# Patient Record
Sex: Male | Born: 1995 | Race: White | Hispanic: No | Marital: Single | Smoking: Former smoker
Health system: Southern US, Community
[De-identification: ages and names within clinical notes are randomized; demographics above are authoritative.]

## PROBLEM LIST (undated history)

## (undated) DIAGNOSIS — F410 Panic disorder [episodic paroxysmal anxiety] without agoraphobia: Secondary | ICD-10-CM

## (undated) DIAGNOSIS — F411 Generalized anxiety disorder: Secondary | ICD-10-CM

## (undated) DIAGNOSIS — F319 Bipolar disorder, unspecified: Secondary | ICD-10-CM

## (undated) DIAGNOSIS — G4700 Insomnia, unspecified: Secondary | ICD-10-CM

## (undated) DIAGNOSIS — F909 Attention-deficit hyperactivity disorder, unspecified type: Secondary | ICD-10-CM

---

## 2016-07-20 ENCOUNTER — Encounter: Payer: Self-pay | Admitting: Emergency Medicine

## 2016-07-20 ENCOUNTER — Emergency Department (INDEPENDENT_AMBULATORY_CARE_PROVIDER_SITE_OTHER)
Admission: EM | Admit: 2016-07-20 | Discharge: 2016-07-20 | Disposition: A | Discharge status: Home / Self Care | Payer: Self-pay | Source: Home / Self Care | Attending: Family Medicine | Admitting: Family Medicine

## 2016-07-20 DIAGNOSIS — R1010 Upper abdominal pain, unspecified: Secondary | ICD-10-CM

## 2016-07-20 DIAGNOSIS — K219 Gastro-esophageal reflux disease without esophagitis: Secondary | ICD-10-CM

## 2016-07-20 HISTORY — DX: Panic disorder (episodic paroxysmal anxiety): F41.0

## 2016-07-20 HISTORY — DX: Insomnia, unspecified: G47.00

## 2016-07-20 MED ORDER — OMEPRAZOLE 40 MG PO CPDR: 40.0000 mg | DELAYED_RELEASE_CAPSULE | Freq: Every day | ORAL | 2 refills | Status: AC

## 2016-07-20 NOTE — ED Provider Notes (Signed)
[name]    CSN: [id number] Arrival date & time: 07/20/16  1025     History   Chief Complaint Chief Complaint  Patient presents with  . Abdominal Pain    HPI Jeffrey Holder is a 20 y.o. male.   Patient complains of 3 day history of epigastric pain, worse after drinking water, but somewhat improved after eating.  The pain is worse supine and improves slightly after taking Tums.  He has had nausea without vomiting.  He has been constipated during the past 3 days but his bowel movements have been normal otherwise.  He denies melena and hematochezia.  He admits that he drinks about 64 ounces of liquor every 3 to 4 days.  He states that he has tried taking Ibuprofen 2 tabs every 6 to 8 hours for his pain.   The history is provided by the patient.  Abdominal Pain  Pain location:  Epigastric Pain quality: aching and gnawing   Pain radiates to:  Does not radiate Pain severity:  Moderate Onset quality:  Gradual Duration:  3 days Timing:  Intermittent Progression:  Unchanged Chronicity:  Recurrent Context: alcohol use and awakening from sleep   Context: not eating and not previous surgeries   Relieved by:  Eating Exacerbated by: drinking water. Ineffective treatments: Tums. Associated symptoms: anorexia, constipation, fatigue and nausea   Associated symptoms: no chest pain, no diarrhea, no dysuria, no fever, no flatus, no hematemesis, no hematochezia, no hematuria, no melena, no shortness of breath and no vomiting   Risk factors comment:  Alcohol abuse   Past Medical History:  Diagnosis Date  . Insomnia   . Panic disorder     There are no active problems to display for this patient.   History reviewed. No pertinent surgical history.     Home Medications    Prior to Admission medications   Medication Sig Start Date End Date Taking? Authorizing Provider  ALPRAZolam Prudy Feeler) 0.5 MG tablet Take 0.5 mg by mouth at bedtime as needed for anxiety.   Yes  Historical Provider, MD  omeprazole (PRILOSEC) 40 MG capsule Take 1 capsule (40 mg total) by mouth daily. Take 30 minutes before a meal. 07/20/16   Lattie Haw, MD    Family History No family history on file.  Social History Social History  Substance Use Topics  . Smoking status: Current Every Day Smoker    Packs/day: 1.00  . Smokeless tobacco: Never Used  . Alcohol use 15.0 oz/week    25 Standard drinks or equivalent per week     Allergies   Review of patient's allergies indicates no known allergies.   Review of Systems Review of Systems  Constitutional: Positive for fatigue. Negative for fever.  Respiratory: Negative for shortness of breath.   Cardiovascular: Negative for chest pain.  Gastrointestinal: Positive for abdominal pain, anorexia, constipation and nausea. Negative for diarrhea, flatus, hematemesis, hematochezia, melena and vomiting.  Genitourinary: Negative for dysuria and hematuria.  All other systems reviewed and are negative.    Physical Exam Triage Vital Signs ED Triage Vitals  Enc Vitals Group     BP 07/20/16 1050 143/92     Pulse Rate 07/20/16 1050 (!) 123     Resp --      Temp 07/20/16 1050 98.1 F (36.7 C)     Temp Source 07/20/16 1050 Oral     SpO2 07/20/16 1050 98 %     Weight 07/20/16 1051 143 lb 8 oz (65.1 kg)  Height 07/20/16 1051 5' 6.5" (1.689 m)     Head Circumference --      Peak Flow --      Pain Score 07/20/16 1053 7     Pain Loc --      Pain Edu? --      Excl. in [place]? --    No data found.   Updated Vital Signs BP 143/92 (BP Location: Left Arm)   Pulse (!) 123   Temp 98.1 F (36.7 C) (Oral)   Ht 5' 6.5" (1.689 m)   Wt 143 lb 8 oz (65.1 kg)   SpO2 98%   BMI 22.81 kg/m   Visual Acuity Right Eye Distance:   Left Eye Distance:   Bilateral Distance:    Right Eye Near:   Left Eye Near:    Bilateral Near:     Physical Exam  Constitutional: He appears well-developed and well-nourished. No distress.  HENT:    Head: Normocephalic.  Right Ear: External ear normal.  Left Ear: External ear normal.  Nose: Nose normal.  Mouth/Throat: Oropharynx is clear and moist.  Eyes: Conjunctivae are normal. Pupils are equal, round, and reactive to light.  Neck: Normal range of motion. Neck supple.  Cardiovascular: Normal heart sounds.   Pulmonary/Chest: Breath sounds normal.  Abdominal: Soft. Normal appearance and bowel sounds are normal. He exhibits no mass. There is tenderness in the epigastric area and periumbilical area. There is negative Murphy's sign.    Musculoskeletal: He exhibits no edema.  Lymphadenopathy:    He has no cervical adenopathy.  Neurological: He is alert.  Skin: Skin is warm and dry. No rash noted.  Nursing note and vitals reviewed.    UC Treatments / Results  Labs (all labs ordered are listed, but only abnormal results are displayed) Labs Reviewed - No data to display  EKG  EKG Interpretation None       Radiology No results found.  Procedures Procedures (including critical care time)  Medications Ordered in UC Medications - No data to display   Initial Impression / Assessment and Plan / UC Course  I have reviewed the triage vital signs and the nursing notes.  Pertinent labs & imaging results that were available during my care of the patient were reviewed by me and considered in my medical decision making (see chart for details).  Clinical Course  Patient's history suggests peptic ulcer, as well as GERD History of alcohol abuse.  Recommend discontinuing alcohol; consider AA.  Recommend discontinuing smoking. Begin omeprazole 40mg  Daily. Followup with Family Doctor as soon as possible for management.  Recommend GI evaluation also.     Final Clinical Impressions(s) / UC Diagnoses   Final diagnoses:  Pain of upper abdomen  Gastroesophageal reflux disease, esophagitis presence not specified    New Prescriptions New Prescriptions   OMEPRAZOLE (PRILOSEC) 40  MG CAPSULE    Take 1 capsule (40 mg total) by mouth daily. Take 30 minutes before a meal.     Lattie HawStephen A Mindel Friscia, MD 07/31/16 [id number]

## 2019-06-14 ENCOUNTER — Other Ambulatory Visit: Payer: Self-pay

## 2019-06-14 ENCOUNTER — Encounter: Payer: Self-pay | Admitting: Family Medicine

## 2019-06-14 ENCOUNTER — Emergency Department (INDEPENDENT_AMBULATORY_CARE_PROVIDER_SITE_OTHER)
Admission: EM | Admit: 2019-06-14 | Discharge: 2019-06-14 | Disposition: A | Discharge status: Home / Self Care | Payer: BLUE CROSS/BLUE SHIELD | Source: Home / Self Care

## 2019-06-14 DIAGNOSIS — K0889 Other specified disorders of teeth and supporting structures: Secondary | ICD-10-CM

## 2019-06-14 MED ORDER — CHLORHEXIDINE GLUCONATE 0.12 % MT SOLN: 15.0000 mL | Freq: Two times a day (BID) | OROMUCOSAL | 0 refills | Status: DC

## 2019-06-14 MED ORDER — AMOXICILLIN 875 MG PO TABS: 875.0000 mg | ORAL_TABLET | Freq: Two times a day (BID) | ORAL | 0 refills | Status: DC

## 2019-06-14 NOTE — ED Provider Notes (Signed)
[facility]    CSN: [id number] Arrival date & time: 06/14/19  1903      History   Chief Complaint Chief Complaint  Patient presents with  . Dental Pain    HPI Jeffrey Holder is a 23 y.o. male.   23 yo established [facility] patient with dental pain.  He has recurrent canker sores and also has had diagnosed a wisdom tooth.  He has dental insurance but it does not cover removal of wisdom tooth  He teaches music.     Past Medical History:  Diagnosis Date  . Insomnia   . Panic disorder     There are no active problems to display for this patient.   History reviewed. No pertinent surgical history.     Home Medications    Prior to Admission medications   Medication Sig Start Date End Date Taking? Authorizing Provider  amphetamine-dextroamphetamine (ADDERALL) 20 MG tablet Take 20 mg by mouth 2 (two) times daily.   Yes [provider]  gabapentin (NEURONTIN) 300 MG capsule Take 300 mg by mouth 3 (three) times daily.   Yes [provider]  QUEtiapine (SEROQUEL) 25 MG tablet Take 25 mg by mouth at bedtime.   Yes [provider]  amoxicillin (AMOXIL) 875 MG tablet Take 1 tablet (875 mg total) by mouth 2 (two) times daily. 06/14/19   Robyn Haber, MD  chlorhexidine (PERIDEX) 0.12 % solution Use as directed 15 mLs in the mouth or throat 2 (two) times daily. 06/14/19   Robyn Haber, MD  omeprazole (PRILOSEC) 40 MG capsule Take 1 capsule (40 mg total) by mouth daily. Take 30 minutes before a meal. 07/20/16   Kandra Nicolas, MD    Family History History reviewed. No pertinent family history.  Social History Social History   Tobacco Use  . Smoking status: Current Every Day Smoker    Packs/day: 1.00  . Smokeless tobacco: Never Used  Substance Use Topics  . Alcohol use: Yes    Alcohol/week: 25.0 standard drinks    Types: 25 Standard drinks or equivalent per week  . Drug use: No     Allergies   Ciprofloxacin   Review of Systems  Review of Systems  HENT: Positive for dental problem.   All other systems reviewed and are negative.    Physical Exam Triage Vital Signs ED Triage Vitals  Enc Vitals Group     BP      Pulse      Resp      Temp      Temp src      SpO2      Weight      Height      Head Circumference      Peak Flow      Pain Score      Pain Loc      Pain Edu?      Excl. in [place]?    No data found.  Updated Vital Signs BP (!) 152/78 (BP Location: Right Arm)   Pulse 63   Temp 98.4 F (36.9 C) (Oral)   Resp 18   Ht 5\' 6"  (1.676 m)   Wt 55.8 kg   SpO2 100%   BMI 19.85 kg/m      Physical Exam Vitals signs and nursing note reviewed.  Constitutional:      Appearance: Normal appearance. He is normal weight.  HENT:     Mouth/Throat:     Mouth: Mucous membranes are moist.  Comments: Mild leukoplakia surrounding erupting wisdom tooth on right lower area. Neck:     Musculoskeletal: Normal range of motion and neck supple.  Cardiovascular:     Rate and Rhythm: Normal rate.  Pulmonary:     Effort: Pulmonary effort is normal.  Musculoskeletal: Normal range of motion.  Lymphadenopathy:     Cervical: No cervical adenopathy.  Skin:    General: Skin is warm.  Neurological:     General: No focal deficit present.     Mental Status: He is alert.      UC Treatments / Results  Labs (all labs ordered are listed, but only abnormal results are displayed) Labs Reviewed - No data to display  EKG   Radiology No results found.  Procedures Procedures (including critical care time)  Medications Ordered in UC Medications - No data to display  Initial Impression / Assessment and Plan / UC Course  I have reviewed the triage vital signs and the nursing notes.  Pertinent labs & imaging results that were available during my care of the patient were reviewed by me and considered in my medical decision making (see chart for details).    Final Clinical Impressions(s) / UC Diagnoses    Final diagnoses:  Pain, dental     Discharge Instructions     Ultimately, you will need to have a dentist evaluate the wisdom tooth    ED Prescriptions    Medication Sig Dispense Auth. Provider   chlorhexidine (PERIDEX) 0.12 % solution Use as directed 15 mLs in the mouth or throat 2 (two) times daily. 120 mL Elvina SidleLauenstein, Melecio Cueto, MD   amoxicillin (AMOXIL) 875 MG tablet Take 1 tablet (875 mg total) by mouth 2 (two) times daily. 20 tablet Elvina SidleLauenstein, Annora Guderian, MD     Controlled Substance Prescriptions [place] Controlled Substance Registry consulted? Not Applicable   Elvina SidleLauenstein, Cornelius Marullo, MD 06/14/19 1929

## 2019-06-14 NOTE — ED Triage Notes (Signed)
Patient c/o pain on the right side of his mouth. Using clove and coconut oil with relief.

## 2019-06-14 NOTE — Discharge Instructions (Addendum)
Ultimately, you will need to have a dentist evaluate the wisdom tooth

## 2019-11-30 ENCOUNTER — Emergency Department
Admission: EM | Admit: 2019-11-30 | Discharge: 2019-11-30 | Disposition: A | Discharge status: Home / Self Care | Payer: BC Managed Care – PPO | Source: Home / Self Care | Attending: Family Medicine | Admitting: Family Medicine

## 2019-11-30 ENCOUNTER — Emergency Department (INDEPENDENT_AMBULATORY_CARE_PROVIDER_SITE_OTHER): Payer: BC Managed Care – PPO

## 2019-11-30 ENCOUNTER — Other Ambulatory Visit: Payer: Self-pay

## 2019-11-30 DIAGNOSIS — R1030 Lower abdominal pain, unspecified: Secondary | ICD-10-CM

## 2019-11-30 LAB — POCT URINALYSIS DIP (MANUAL ENTRY)
Bilirubin, UA: NEGATIVE
Blood, UA: NEGATIVE
Glucose, UA: NEGATIVE mg/dL
Ketones, POC UA: NEGATIVE mg/dL
Leukocytes, UA: NEGATIVE
Nitrite, UA: NEGATIVE
Protein Ur, POC: NEGATIVE mg/dL
Spec Grav, UA: 1.02 (ref 1.010–1.025)
Urobilinogen, UA: 0.2 E.U./dL
pH, UA: 8.5 — AB (ref 5.0–8.0)

## 2019-11-30 LAB — POCT CBC W AUTO DIFF (K'VILLE URGENT CARE)

## 2019-11-30 NOTE — ED Provider Notes (Signed)
[facility]    CSN: [id number] Arrival date & time: 11/30/19  1814      History   Chief Complaint Chief Complaint  Patient presents with  . Abdominal Pain  . Rectal Pain  . Diarrhea    HPI Jeffrey Holder is a 24 y.o. male.   I personally reviewed patient's recent [facility] records. Patient was evaluated by his PCP on 11/19/19 for STD testing, reporting that he had had anal receptive sex the prior week.  He had had some perineal discomfort and a small amount of anal bleeding the next day that had resolved. His evaluation was unremarkable including negative RPR, GC/chlamydia, HIV.  His CBC and CMP were unremarkable. Recently he has developed persistent lower abdominal pain and loose stools.  He is concerned that he may have seen some worms in his stool.  He has had vague perineal discomfort but no pain with defecation.  He denies recent hematochezia or melena.  He denies fever but has had some chills today.  He denies dysuria, frequency, and urethral discharge.           Abdominal Pain Associated symptoms: chills, diarrhea, fatigue and nausea   Associated symptoms: no constipation, no fever and no vomiting   Diarrhea Associated symptoms: abdominal pain and chills   Associated symptoms: no diaphoresis, no fever and no vomiting     Past Medical History:  Diagnosis Date  . Insomnia   . Panic disorder     There are no problems to display for this patient.   History reviewed. No pertinent surgical history.     Home Medications    Prior to Admission medications   Medication Sig Start Date End Date Taking? Authorizing Provider  amoxicillin (AMOXIL) 875 MG tablet Take 1 tablet (875 mg total) by mouth 2 (two) times daily. 06/14/19   Elvina Sidle, MD  amphetamine-dextroamphetamine (ADDERALL) 20 MG tablet Take 20 mg by mouth 2 (two) times daily.    [provider]  chlorhexidine (PERIDEX) 0.12 % solution Use as directed 15 mLs in the mouth or  throat 2 (two) times daily. 06/14/19   Elvina Sidle, MD  gabapentin (NEURONTIN) 300 MG capsule Take 300 mg by mouth 3 (three) times daily.    [provider]  omeprazole (PRILOSEC) 40 MG capsule Take 1 capsule (40 mg total) by mouth daily. Take 30 minutes before a meal. 07/20/16   Lattie Haw, MD  QUEtiapine (SEROQUEL) 25 MG tablet Take 25 mg by mouth at bedtime.    [provider]    Family History History reviewed. No pertinent family history.  Social History Social History   Tobacco Use  . Smoking status: Former Smoker    Packs/day: 1.00    Quit date: 11/29/2017    Years since quitting: 2.0  . Smokeless tobacco: Never Used  Substance Use Topics  . 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 Drug use: Yes    Types: Marijuana    Comment: weekly     Allergies   Ciprofloxacin   Review of Systems Review of Systems  Constitutional: Positive for activity change, appetite change, chills and fatigue. Negative for diaphoresis, fever and unexpected weight change.  HENT: Negative.   Eyes: Negative.   Respiratory: Negative.   Cardiovascular: Negative.   Gastrointestinal: Positive for abdominal pain, diarrhea and nausea. Negative for abdominal  distention, anal bleeding, blood in stool, constipation, rectal pain and vomiting.  Genitourinary: Negative.   Musculoskeletal: Positive for back pain.  Skin: Negative.   Neurological: Negative.      Physical Exam Triage Vital Signs ED Triage Vitals  Enc Vitals Group     BP 11/30/19 1830 132/78     Pulse Rate 11/30/19 1830 98     Resp 11/30/19 1830 20     Temp 11/30/19 1830 98.9 F (37.2 C)     Temp Source 11/30/19 1830 Oral     SpO2 11/30/19 1830 100 %     Weight 11/30/19 1831 124 lb (56.2 kg)     Height 11/30/19 1831 5\' 6"  (1.676 m)     Head Circumference --      Peak Flow --      Pain Score  11/30/19 1831 6     Pain Loc --      Pain Edu? --      Excl. in [place]? --    No data found.  Updated Vital Signs BP 132/78 (BP Location: Right Arm)   Pulse 98   Temp 98.9 F (37.2 C) (Oral)   Resp 20   Ht 5\' 6"  (1.676 m)   Wt 56.2 kg   SpO2 100%   BMI 20.01 kg/m   Visual Acuity Right Eye Distance:   Left Eye Distance:   Bilateral Distance:    Right Eye Near:   Left Eye Near:    Bilateral Near:     Physical Exam Vitals and nursing note reviewed.  Constitutional:      General: He is not in acute distress.    Appearance: He is not ill-appearing.  HENT:     Head: Normocephalic.     Nose: Nose normal.     Mouth/Throat:     Mouth: Mucous membranes are moist.     Pharynx: Oropharynx is clear.  Eyes:     Conjunctiva/sclera: Conjunctivae normal.     Pupils: Pupils are equal, round, and reactive to light.  Cardiovascular:     Rate and Rhythm: Normal rate.     Heart sounds: Normal heart sounds.  Pulmonary:     Breath sounds: Normal breath sounds.  Abdominal:     General: Abdomen is flat. Bowel sounds are normal. There is no distension.     Palpations: Abdomen is soft. There is no hepatomegaly or splenomegaly.     Tenderness: There is abdominal tenderness in the right lower quadrant, periumbilical area and left lower quadrant. There is no right CVA tenderness, left CVA tenderness, guarding or rebound. Negative signs include McBurney's sign.       Comments: No perirectal tenderness palpated.  Musculoskeletal:     Right lower leg: No edema.     Left lower leg: No edema.  Lymphadenopathy:     Cervical: No cervical adenopathy.  Skin:    General: Skin is warm and dry.     Findings: No rash.  Neurological:     Mental Status: He is alert and oriented to person, place, and time.      UC Treatments / Results  Labs (all labs ordered are listed, but only abnormal results are displayed) Labs Reviewed  POCT URINALYSIS DIP (MANUAL ENTRY) - Abnormal; Notable for the  following components:      Result Value   pH, UA 8.5 (*)    All other components within normal limits  POCT CBC W AUTO DIFF ([facility]):  WBC 3.2; LY 49.0; MO  3.2; GR 47.8; Hgb 14.1; Platelets 296     EKG   Radiology DG Abdomen 1 View  Result Date: 11/30/2019 CLINICAL DATA:  Lower abdominal pain EXAM: ABDOMEN - 1 VIEW COMPARISON:  None. FINDINGS: The bowel gas pattern is normal. Moderate amount of rectal colonic stool present. No radio-opaque calculi or other significant radiographic abnormality are seen. IMPRESSION: Moderate amount of rectal colonic stool. Nonobstructive bowel gas pattern. Electronically Signed   By: Jonna Clark M.D.   On: 11/30/2019 19:37    Procedures Procedures (including critical care time)  Medications Ordered in UC Medications - No data to display  Initial Impression / Assessment and Plan / UC Course  I have reviewed the triage vital signs and the nursing notes.  Pertinent labs & imaging results that were available during my care of the patient were reviewed by me and considered in my medical decision making (see chart for details).     Note mild leukopenia (WBC 3.2); ?viral syndrome. Suspect viral gastroenteritis. Note moderate amount of rectal colonic stool.  Suspect constipation as source of his vague perineal discomfort.  Will send stool specimen for O and P evaluation (patient had concern for possible parasitic infection). Followup with Family Doctor in about one week.  Final Clinical Impressions(s) / UC Diagnoses   Final diagnoses:  Lower abdominal pain     Discharge Instructions     Begin Pedialyte until diarrhea improves, then switch to clear liquids (apple juice, Jello, etc) until improved, then advance to a [employer] (Bananas, Rice, Applesauce, Toast).  Then gradually resume a regular diet when tolerated.  Avoid milk products until well.   Recommend taking daily Miralax, approximately 1/4 to 1/2 capful mixed in 4 to 8 ounces of  water until bowel movements are regular. Recommend increasing natural fiber in diet.  May also take a daily fiber product such as Citrucel with plenty of fluid.      If symptoms become significantly worse during the night or over the weekend, proceed to the local emergency room.      ED Prescriptions    None        Lattie Haw, MD 12/01/19 2303

## 2019-11-30 NOTE — ED Triage Notes (Signed)
Pain in abdomen for several days, the pain has been going on since was seen by pcp. Has been having rectal pain since.  Pt has also been having diarrhea.  Anal pain since seen by PCP.  Had blood in stool at first, but blood has resolved.  Now has mucous in stool.

## 2019-11-30 NOTE — Discharge Instructions (Signed)
Begin Pedialyte until diarrhea improves, then switch to clear liquids (apple juice, Jello, etc) until improved, then advance to a [employer] (Bananas, Rice, Applesauce, Toast).  Then gradually resume a regular diet when tolerated.  Avoid milk products until well.   Recommend taking daily Miralax, approximately 1/4 to 1/2 capful mixed in 4 to 8 ounces of water until bowel movements are regular. Recommend increasing natural fiber in diet.  May also take a daily fiber product such as Citrucel with plenty of fluid.      If symptoms become significantly worse during the night or over the weekend, proceed to the local emergency room.

## 2020-01-28 ENCOUNTER — Other Ambulatory Visit: Payer: Self-pay

## 2020-01-28 ENCOUNTER — Encounter: Payer: Self-pay | Admitting: Emergency Medicine

## 2020-01-28 ENCOUNTER — Emergency Department
Admission: EM | Admit: 2020-01-28 | Discharge: 2020-01-28 | Disposition: A | Discharge status: Home / Self Care | Payer: BC Managed Care – PPO | Source: Home / Self Care | Attending: Family Medicine | Admitting: Family Medicine

## 2020-01-28 DIAGNOSIS — M5441 Lumbago with sciatica, right side: Secondary | ICD-10-CM

## 2020-01-28 DIAGNOSIS — Z202 Contact with and (suspected) exposure to infections with a predominantly sexual mode of transmission: Secondary | ICD-10-CM

## 2020-01-28 HISTORY — DX: Bipolar disorder, unspecified: F31.9

## 2020-01-28 HISTORY — DX: Attention-deficit hyperactivity disorder, unspecified type: F90.9

## 2020-01-28 HISTORY — DX: Generalized anxiety disorder: F41.1

## 2020-01-28 LAB — POCT URINALYSIS DIP (MANUAL ENTRY)
Bilirubin, UA: NEGATIVE
Blood, UA: NEGATIVE
Glucose, UA: NEGATIVE mg/dL
Ketones, POC UA: NEGATIVE mg/dL
Leukocytes, UA: NEGATIVE
Nitrite, UA: NEGATIVE
Protein Ur, POC: NEGATIVE mg/dL
Spec Grav, UA: 1.02 (ref 1.010–1.025)
Urobilinogen, UA: 0.2 E.U./dL
pH, UA: 5.5 (ref 5.0–8.0)

## 2020-01-28 MED ORDER — PREDNISONE 20 MG PO TABS: ORAL_TABLET | ORAL | 0 refills | Status: DC

## 2020-01-28 NOTE — ED Provider Notes (Signed)
[name]    CSN: [id number] Arrival date & time: 01/28/20  1824      History   Chief Complaint Chief Complaint  Patient presents with  . Back Pain  . Exposure to STD    HPI Jeffrey Holder is a 24 y.o. male.   Patient presents for repeat STD testing, although he is assymptomatic at present and feels well.  He had a thorough negative evaluation through [facility] on 12/27/19.  He complains of persistent vague pain in his right lower back for two weeks.  During the past several days he has developed intermittent radiation into his right posterior thigh.  He recalls no injury.   He denies bowel or bladder dysfunction, and no saddle numbness.    Back Pain Location:  Lumbar spine Quality:  Aching Radiates to:  R posterior upper leg Pain severity:  Mild Pain is:  Same all the time Onset quality:  Gradual Duration:  2 weeks Timing:  Constant Progression:  Worsening Chronicity:  New Context: not recent injury   Relieved by:  None tried Worsened by:  Movement Ineffective treatments:  None tried Associated symptoms: paresthesias   Associated symptoms: no abdominal pain, no abdominal swelling, no bladder incontinence, no bowel incontinence, no dysuria, no fever, no leg pain, no numbness, no pelvic pain, no perianal numbness, no tingling, no weakness and no weight loss     Past Medical History:  Diagnosis Date  . ADHD   . Bipolar 1 disorder (HCC)   . GAD (generalized anxiety disorder)   . Insomnia   . Panic disorder     There are no problems to display for this patient.   History reviewed. No pertinent surgical history.     Home Medications    Prior to Admission medications   Medication Sig Start Date End Date Taking? Authorizing Provider  amphetamine-dextroamphetamine (ADDERALL) 20 MG tablet Take 20 mg by mouth 2 (two) times daily.    [provider]  chlorhexidine (PERIDEX) 0.12 % solution Use as directed 15 mLs in the mouth or throat 2 (two)  times daily. 06/14/19   Elvina Sidle, MD  gabapentin (NEURONTIN) 300 MG capsule Take 300 mg by mouth 3 (three) times daily.    [provider]  omeprazole (PRILOSEC) 40 MG capsule Take 1 capsule (40 mg total) by mouth daily. Take 30 minutes before a meal. 07/20/16   Lattie Haw, MD  predniSONE (DELTASONE) 20 MG tablet Take one tab by mouth twice daily for 4 days, then one daily. Take with food. 01/28/20   Lattie Haw, MD  QUEtiapine (SEROQUEL) 25 MG tablet Take 25 mg by mouth at bedtime.    [provider]    Family History No family history on file.  Social History Social History   Tobacco Use  . Smoking status: Former Smoker    Packs/day: 1.00    Quit date: 11/29/2017    Years since quitting: 2.1  . Smokeless tobacco: Never Used  Substance Use Topics  . 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 Drug use: Not Currently    Types: Marijuana, Cocaine    Comment: weekly     Allergies   Ciprofloxacin   Review of Systems Review of Systems  Constitutional: Negative for chills, diaphoresis, fatigue, fever, unexpected weight change and weight loss.  Gastrointestinal: Negative for abdominal pain  and bowel incontinence.  Genitourinary: Negative for bladder incontinence, difficulty urinating, discharge, dysuria, flank pain, frequency, genital sores, hematuria, pelvic pain, penile pain, penile swelling, scrotal swelling, testicular pain and urgency.  Musculoskeletal: Positive for back pain.  Neurological: Positive for paresthesias. Negative for tingling, weakness and numbness.  All other systems reviewed and are negative.    Physical Exam Triage Vital Signs ED Triage Vitals  Enc Vitals Group     BP      Pulse      Resp      Temp      Temp src      SpO2      Weight      Height      Head Circumference      Peak Flow      Pain Score        Pain Loc      Pain Edu?      Excl. in GC?    No data found.  Updated Vital Signs BP 105/71 (BP Location: Right Arm)   Pulse (!) 108   Temp 98.3 F (36.8 C) (Oral)   Resp 18   Ht 5\' 6"  (1.676 m)   Wt 58.1 kg   SpO2 96%   BMI 20.66 kg/m   Visual Acuity Right Eye Distance:   Left Eye Distance:   Bilateral Distance:    Right Eye Near:   Left Eye Near:    Bilateral Near:     Physical Exam Vitals and nursing note reviewed.  Constitutional:      General: He is not in acute distress.    Appearance: He is normal weight.  HENT:     Head: Normocephalic.     Nose: Nose normal.     Mouth/Throat:     Pharynx: Oropharynx is clear.  Eyes:     Conjunctiva/sclera: Conjunctivae normal.     Pupils: Pupils are equal, round, and reactive to light.  Cardiovascular:     Rate and Rhythm: Tachycardia present.     Heart sounds: Normal heart sounds.  Pulmonary:     Breath sounds: Normal breath sounds.  Abdominal:     Palpations: Abdomen is soft.     Tenderness: There is no abdominal tenderness.  Musculoskeletal:        General: Normal range of motion.     Lumbar back: No swelling, spasms, tenderness or bony tenderness. Normal range of motion. Negative right straight leg raise test and negative left straight leg raise test.       Back:     Comments: Back:  Range of motion relatively well preserved.  Can heel/toe walk and squat without difficulty. Patient has pain in the right paraspinous muscles from L4 to Sacral area but there is no tenderness to palpation in this area.  Straight leg raising test is negative.  Sitting knee extension test is negative.  Strength and sensation in the lower extremities is normal.  Patellar and achilles reflexes are normal   Lymphadenopathy:     Cervical: No cervical adenopathy.  Skin:    General: Skin is warm and dry.     Findings: No rash.  Neurological:     Mental Status: He is alert.      UC Treatments / Results  Labs (all labs ordered are  listed, but only abnormal results are displayed) Labs Reviewed  POCT URINALYSIS DIP (MANUAL ENTRY) - Abnormal; Notable for the following components:      Result Value   Color, UA light  yellow (*)    All other components within normal limits  C. TRACHOMATIS/N. GONORRHOEAE RNA  GC/CHLAMYDIA PROBE AMP  RPR  HIV ANTIBODY (ROUTINE TESTING W REFLEX)  HSV 2 ANTIBODY, IGG    EKG   Radiology No results found.  Procedures Procedures (including critical care time)  Medications Ordered in UC Medications - No data to display  Initial Impression / Assessment and Plan / UC Course  I have reviewed the triage vital signs and the nursing notes.  Pertinent labs & imaging results that were available during my care of the patient were reviewed by me and considered in my medical decision making (see chart for details).    STD screening labs as noted. Begin prednisone burst/taper. Followup with Dr. Aundria Mems ([facility]) if back pain not improving about two weeks.   Final Clinical Impressions(s) / UC Diagnoses   Final diagnoses:  Exposure to STD  Acute right-sided low back pain with right-sided sciatica     Discharge Instructions     Apply ice pack to right lower back for 20 to 30 minutes, 3 to 4 times daily  Continue until pain decreases.     ED Prescriptions    Medication Sig Dispense Auth. Provider   predniSONE (DELTASONE) 20 MG tablet Take one tab by mouth twice daily for 4 days, then one daily. Take with food. 12 tablet Kandra Nicolas, MD        Kandra Nicolas, MD 01/31/20 [phone]

## 2020-01-28 NOTE — Discharge Instructions (Addendum)
Apply ice pack to right lower back for 20 to 30 minutes, 3 to 4 times daily  Continue until pain decreases.

## 2020-01-28 NOTE — ED Triage Notes (Signed)
Patient here for pain in right lower back intermittent for past 2 weeks since sleeping wrong; and for follow up on STD testing. He had complete STD work up at [facility] end of March with all tests negative except for HSV; was told too soon from exposure time to know if HIV was validated with test. No known exposure covid positive person.

## 2020-01-29 LAB — C. TRACHOMATIS/N. GONORRHOEAE RNA
C. trachomatis RNA, TMA: NOT DETECTED
N. gonorrhoeae RNA, TMA: NOT DETECTED

## 2020-01-31 LAB — HSV 2 ANTIBODY, IGG: HSV 2 Glycoprotein G Ab, IgG: <0.9 index

## 2020-01-31 LAB — RPR: RPR Ser Ql: NONREACTIVE

## 2020-01-31 LAB — HIV ANTIBODY (ROUTINE TESTING W REFLEX): HIV 1&2 Ab, 4th Generation: NONREACTIVE

## 2020-04-14 ENCOUNTER — Emergency Department (INDEPENDENT_AMBULATORY_CARE_PROVIDER_SITE_OTHER)
Admission: EM | Admit: 2020-04-14 | Discharge: 2020-04-14 | Disposition: A | Discharge status: Home / Self Care | Payer: BLUE CROSS/BLUE SHIELD | Source: Home / Self Care | Attending: Family Medicine | Admitting: Family Medicine

## 2020-04-14 ENCOUNTER — Other Ambulatory Visit: Payer: Self-pay

## 2020-04-14 DIAGNOSIS — K0889 Other specified disorders of teeth and supporting structures: Secondary | ICD-10-CM | POA: Diagnosis not present

## 2020-04-14 MED ORDER — HYDROCODONE-ACETAMINOPHEN 5-325 MG PO TABS: ORAL_TABLET | ORAL | 0 refills | Status: DC

## 2020-04-14 MED ORDER — AMOXICILLIN-POT CLAVULANATE 875-125 MG PO TABS: 1.0000 | ORAL_TABLET | Freq: Two times a day (BID) | ORAL | 0 refills | Status: AC

## 2020-04-14 NOTE — Discharge Instructions (Addendum)
Try warm salt water gargles for sore throat.   If symptoms become significantly worse during the night or over the weekend, proceed to the local emergency room.

## 2020-04-14 NOTE — ED Provider Notes (Signed)
[facility]    CSN: [id number] Arrival date & time: 04/14/20  1759      History   Chief Complaint Chief Complaint  Patient presents with  . Dental Pain    HPI Jeffrey Holder is a 24 y.o. male.   Patient complains of pain/swelling in an un-erupted left lower wisdom tooth for four days.  He has had mild swelling in his left face, mild sore throat, and chills.  He denies trismus.  The history is provided by the patient.  Dental Pain Location:  Lower Lower teeth location:  17/LL 3rd molar Quality:  Throbbing, aching, constant and localized Severity:  Moderate Onset quality:  Gradual Duration:  4 days Timing:  Constant Progression:  Worsening Chronicity:  Recurrent Context: poor dentition   Relieved by:  Nothing Worsened by:  Jaw movement and pressure Ineffective treatments:  NSAIDs Associated symptoms: facial pain, facial swelling and gum swelling   Associated symptoms: no congestion, no difficulty swallowing, no drooling, no fever, no headaches, no neck pain, no neck swelling, no oral bleeding, no oral lesions and no trismus   Risk factors: lack of dental care     Past Medical History:  Diagnosis Date  . ADHD   . Bipolar 1 disorder (HCC)   . GAD (generalized anxiety disorder)   . Insomnia   . Panic disorder     There are no problems to display for this patient.   History reviewed. No pertinent surgical history.     Home Medications    Prior to Admission medications   Medication Sig Start Date End Date Taking? Authorizing Provider  amoxicillin-clavulanate (AUGMENTIN) 875-125 MG tablet Take 1 tablet by mouth 2 (two) times daily for 14 days. 04/14/20 04/28/20  Lattie Haw, MD  amphetamine-dextroamphetamine (ADDERALL) 20 MG tablet Take 20 mg by mouth 2 (two) times daily.    [provider]  chlorhexidine (PERIDEX) 0.12 % solution Use as directed 15 mLs in the mouth or throat 2 (two) times daily. 06/14/19   Elvina Sidle, MD  gabapentin  (NEURONTIN) 300 MG capsule Take 300 mg by mouth 3 (three) times daily.    [provider]  HYDROcodone-acetaminophen (NORCO/VICODIN) 5-325 MG tablet Take one by mouth at bedtime as needed for pain 04/14/20   Lattie Haw, MD  omeprazole (PRILOSEC) 40 MG capsule Take 1 capsule (40 mg total) by mouth daily. Take 30 minutes before a meal. 07/20/16   Lattie Haw, MD  predniSONE (DELTASONE) 20 MG tablet Take one tab by mouth twice daily for 4 days, then one daily. Take with food. 01/28/20   Lattie Haw, MD  QUEtiapine (SEROQUEL) 25 MG tablet Take 25 mg by mouth at bedtime.    [provider]    Family History Family History  Problem Relation Age of Onset  . Healthy Mother   . Healthy Father     Social History Social History   Tobacco Use  . Smoking status: Former Smoker    Packs/day: 1.00    Quit date: 11/29/2017    Years since quitting: 2.3  . Smokeless tobacco: Never Used  Vaping Use  . Vaping Use: Some days  . Substances: Nicotine, Flavoring  Substance Use Topics  . 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 Drug use: Not Currently    Types: Marijuana    Comment: weekly  Allergies   Ciprofloxacin   Review of Systems Review of Systems  Constitutional: Positive for chills. Negative for appetite change, diaphoresis, fatigue and fever.  HENT: Positive for facial swelling. Negative for congestion, drooling and mouth sores.   Musculoskeletal: Negative for neck pain.  Neurological: Negative for headaches.  All other systems reviewed and are negative.    Physical Exam Triage Vital Signs ED Triage Vitals  Enc Vitals Group     BP 04/14/20 1819 114/67     Pulse Rate 04/14/20 1819 65     Resp 04/14/20 1819 16     Temp 04/14/20 1819 98.5 F (36.9 C)     Temp Source 04/14/20 1819 Oral     SpO2 04/14/20 1819 99 %     Weight --       Height --      Head Circumference --      Peak Flow --      Pain Score 04/14/20 1817 5     Pain Loc --      Pain Edu? --      Excl. in GC? --    No data found.  Updated Vital Signs BP 114/67 (BP Location: Right Arm)   Pulse 65   Temp 98.5 F (36.9 C) (Oral)   Resp 16   SpO2 99%   Visual Acuity Right Eye Distance:   Left Eye Distance:   Bilateral Distance:    Right Eye Near:   Left Eye Near:    Bilateral Near:     Physical Exam Vitals and nursing note reviewed.  Constitutional:      General: He is not in acute distress. HENT:     Head: Normocephalic.     Right Ear: There is impacted cerumen.     Left Ear: There is impacted cerumen.     Nose: No congestion.     Mouth/Throat:     Mouth: Mucous membranes are moist.     Dentition: Dental tenderness and gingival swelling present.      Comments: Tenderness at gingiva surrounding non-erupted tooth #17.  Mild swelling/tenderness left cheek.  No trismus. Eyes:     Conjunctiva/sclera: Conjunctivae normal.     Pupils: Pupils are equal, round, and reactive to light.  Cardiovascular:     Rate and Rhythm: Regular rhythm.     Heart sounds: Normal heart sounds.  Pulmonary:     Breath sounds: Normal breath sounds.  Musculoskeletal:     Cervical back: Neck supple.  Lymphadenopathy:     Cervical: No cervical adenopathy.  Skin:    General: Skin is warm and dry.     Findings: No rash.  Neurological:     Mental Status: He is alert.      UC Treatments / Results  Labs (all labs ordered are listed, but only abnormal results are displayed) Labs Reviewed - No data to display  EKG   Radiology No results found.  Procedures Procedures (including critical care time)  Medications Ordered in UC Medications - No data to display  Initial Impression / Assessment and Plan / UC Course  I have reviewed the triage vital signs and the nursing notes.  Pertinent labs & imaging results that were available during my care of the  patient were reviewed by me and considered in my medical decision making (see chart for details).    Begin Augmentin. Rx for Lortab at bedtime (#5, no refill). Controlled Substance Prescriptions I have consulted the [place] Controlled Substances Registry for this patient,  and feel the risk/benefit ratio today is favorable for proceeding with this prescription for a controlled substance.   Followup with dentist as soon as possible.    Final Clinical Impressions(s) / UC Diagnoses   Final diagnoses:  Pain, dental     Discharge Instructions     Try warm salt water gargles for sore throat.   If symptoms become significantly worse during the night or over the weekend, proceed to the local emergency room.     ED Prescriptions    Medication Sig Dispense Auth. Provider   amoxicillin-clavulanate (AUGMENTIN) 875-125 MG tablet Take 1 tablet by mouth 2 (two) times daily for 14 days. 28 tablet Lattie Haw, MD   HYDROcodone-acetaminophen (NORCO/VICODIN) 5-325 MG tablet Take one by mouth at bedtime as needed for pain 5 tablet Lattie Haw, MD        Lattie Haw, MD 04/15/20 [phone]

## 2020-04-14 NOTE — ED Triage Notes (Signed)
Patient presents to Urgent Care with complaints of dental pain on the left lower side since a couple days ago while [place] the [place]. Patient reports he has been taking clove oil, pain radiates up into his head as well as down into his neck and ear. Pt thinks his wisdom teeth might be infected again, is trying to save money to have them removed.

## 2020-07-13 ENCOUNTER — Other Ambulatory Visit: Payer: Self-pay

## 2020-07-13 ENCOUNTER — Encounter: Payer: Self-pay | Admitting: Emergency Medicine

## 2020-07-13 ENCOUNTER — Emergency Department (INDEPENDENT_AMBULATORY_CARE_PROVIDER_SITE_OTHER)
Admission: EM | Admit: 2020-07-13 | Discharge: 2020-07-13 | Disposition: A | Discharge status: Home / Self Care | Payer: BLUE CROSS/BLUE SHIELD | Source: Home / Self Care

## 2020-07-13 DIAGNOSIS — K047 Periapical abscess without sinus: Secondary | ICD-10-CM

## 2020-07-13 MED ORDER — CLINDAMYCIN HCL 150 MG PO CAPS: 300.0000 mg | ORAL_CAPSULE | Freq: Three times a day (TID) | ORAL | 0 refills | Status: AC

## 2020-07-13 NOTE — ED Provider Notes (Signed)
[name]    CSN: [id number] Arrival date & time: 07/13/20  0810      History   Chief Complaint Chief Complaint  Patient presents with  . Dental Pain    HPI Jeffrey Holder is a 24 y.o. male.   HPI Jeffrey Holder is a 24 y.o. male presenting to UC with c/o 3 days of gradually left lower tooth pain with gum swelling and drainage of pus.  Pain is aching and throbbing, 8/10, radiating into Left ear, left lower jaw and left side of neck. Denies fever, chills, n/v/d. He has partially impacted wisdom teeth but cannot afford the surgery to have them removed yet.    Past Medical History:  Diagnosis Date  . ADHD   . Bipolar 1 disorder (HCC)   . GAD (generalized anxiety disorder)   . Insomnia   . Panic disorder     There are no problems to display for this patient.   History reviewed. No pertinent surgical history.     Home Medications    Prior to Admission medications   Medication Sig Start Date End Date Taking? Authorizing Provider  amphetamine-dextroamphetamine (ADDERALL) 20 MG tablet Take 20 mg by mouth 2 (two) times daily.    [provider]  chlorhexidine (PERIDEX) 0.12 % solution Use as directed 15 mLs in the mouth or throat 2 (two) times daily. 06/14/19   Elvina Sidle, MD  clindamycin (CLEOCIN) 150 MG capsule Take 2 capsules (300 mg total) by mouth 3 (three) times daily for 7 days. 07/13/20 07/20/20  Lurene Shadow, PA-C  gabapentin (NEURONTIN) 300 MG capsule Take 300 mg by mouth 3 (three) times daily.    [provider]  omeprazole (PRILOSEC) 40 MG capsule Take 1 capsule (40 mg total) by mouth daily. Take 30 minutes before a meal. 07/20/16   Lattie Haw, MD  QUEtiapine (SEROQUEL) 25 MG tablet Take 25 mg by mouth at bedtime.    [provider]    Family History Family History  Problem Relation Age of Onset  . Healthy Mother   . Healthy Father     Social History Social History   Tobacco Use  . Smoking status: Former  Smoker    Packs/day: 1.00    Quit date: 11/29/2017    Years since quitting: 2.6  . Smokeless tobacco: Never Used  Vaping Use  . Vaping Use: Some days  . Substances: Nicotine, Flavoring  Substance Use Topics  . 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 Drug use: Not Currently    Types: Marijuana    Comment: weekly     Allergies   Ciprofloxacin   Review of Systems Review of Systems  Constitutional: Negative for chills and fever.  HENT: Positive for dental problem and facial swelling.   Gastrointestinal: Negative for nausea and vomiting.  Neurological: Negative for dizziness and headaches.     Physical Exam Triage Vital Signs ED Triage Vitals  Enc Vitals Group     BP 07/13/20 0820 115/77     Pulse Rate 07/13/20 0820 98     Resp --      Temp 07/13/20 0820 97.9 F (36.6 C)     Temp Source 07/13/20 0820 Oral     SpO2 07/13/20 0820 100 %     Weight 07/13/20 0821 130 lb (59 kg)  Height 07/13/20 0821 5\' 6"  (1.676 m)     Head Circumference --      Peak Flow --      Pain Score 07/13/20 0821 8     Pain Loc --      Pain Edu? --      Excl. in GC? --    No data found.  Updated Vital Signs BP 115/77 (BP Location: Right Arm)   Pulse 98   Temp 97.9 F (36.6 C) (Oral)   Ht 5\' 6"  (1.676 m)   Wt 130 lb (59 kg)   SpO2 100%   BMI 20.98 kg/m   Visual Acuity Right Eye Distance:   Left Eye Distance:   Bilateral Distance:    Right Eye Near:   Left Eye Near:    Bilateral Near:     Physical Exam Vitals and nursing note reviewed.  Constitutional:      Appearance: Normal appearance. He is well-developed.  HENT:     Head: Normocephalic and atraumatic.     Mouth/Throat:     Lips: Pink.     Mouth: Mucous membranes are moist.     Dentition: Abnormal dentition. Dental tenderness, gingival swelling, dental caries and dental abscesses present.     Pharynx:  Oropharynx is clear. Uvula midline.   Cardiovascular:     Rate and Rhythm: Normal rate.  Pulmonary:     Effort: Pulmonary effort is normal.  Musculoskeletal:        General: Normal range of motion.     Cervical back: Normal range of motion and neck supple. Tenderness (mild left side) present.  Lymphadenopathy:     Cervical: Cervical adenopathy present.  Skin:    General: Skin is warm and dry.  Neurological:     Mental Status: He is alert and oriented to person, place, and time.  Psychiatric:        Behavior: Behavior normal.      UC Treatments / Results  Labs (all labs ordered are listed, but only abnormal results are displayed) Labs Reviewed - No data to display  EKG   Radiology No results found.  Procedures Procedures (including critical care time)  Medications Ordered in UC Medications - No data to display  Initial Impression / Assessment and Plan / UC Course  I have reviewed the triage vital signs and the nursing notes.  Pertinent labs & imaging results that were available during my care of the patient were reviewed by me and considered in my medical decision making (see chart for details).    Hx and exam c/w dental abscess Will start on clindamycin F/u with PCP and/or dentist next week Resource guides for low cost dental care provided AVS given  Final Clinical Impressions(s) / UC Diagnoses   Final diagnoses:  Dental abscess     Discharge Instructions      Please take antibiotics as prescribed and be sure to complete entire course even if you start to feel better to ensure infection does not come back.  You may take 500mg  acetaminophen every 4-6 hours or in combination with ibuprofen 400-600mg  every 6-8 hours as needed for pain, inflammation, and fever.  Call to schedule a follow up appointment with primary care and a dentist next week if not improving.     ED Prescriptions    Medication Sig Dispense Auth. Provider   clindamycin (CLEOCIN) 150  MG capsule Take 2 capsules (300 mg total) by mouth 3 (three) times daily for 7 days. 42 capsule 09/12/20  O, PA-C     I have reviewed the PDMP during this encounter.   Lurene Shadow, [place] 07/13/20 [phone]

## 2020-07-13 NOTE — ED Triage Notes (Signed)
Bottom left tooth pain x 3 days

## 2020-07-13 NOTE — Discharge Instructions (Signed)
  Please take antibiotics as prescribed and be sure to complete entire course even if you start to feel better to ensure infection does not come back.  You may take 500mg  acetaminophen every 4-6 hours or in combination with ibuprofen 400-600mg  every 6-8 hours as needed for pain, inflammation, and fever.  Call to schedule a follow up appointment with primary care and a dentist next week if not improving.

## 2020-09-26 ENCOUNTER — Encounter: Payer: Self-pay | Admitting: Family Medicine

## 2020-09-26 ENCOUNTER — Emergency Department (INDEPENDENT_AMBULATORY_CARE_PROVIDER_SITE_OTHER)
Admission: EM | Admit: 2020-09-26 | Discharge: 2020-09-26 | Disposition: A | Discharge status: Home / Self Care | Payer: BLUE CROSS/BLUE SHIELD | Source: Home / Self Care

## 2020-09-26 ENCOUNTER — Other Ambulatory Visit: Payer: Self-pay

## 2020-09-26 DIAGNOSIS — K0889 Other specified disorders of teeth and supporting structures: Secondary | ICD-10-CM

## 2020-09-26 MED ORDER — AMOXICILLIN 875 MG PO TABS: 875.0000 mg | ORAL_TABLET | Freq: Two times a day (BID) | ORAL | 0 refills | Status: AC

## 2020-09-26 NOTE — ED Triage Notes (Signed)
Pt c/o dental pain x 1 week. Had wisdom teeth removed 11/4. Tx for dry socket twice. Now having jaw pain and ear pain. Tried to call dentist and couldn't get fit in today.

## 2020-09-26 NOTE — Discharge Instructions (Signed)
Music:  Kathaleen Bury on TED talk

## 2020-09-26 NOTE — ED Provider Notes (Signed)
[name]    CSN: [id number] Arrival date & time: 09/26/20  1331      History   Chief Complaint Chief Complaint  Patient presents with  . Dental Pain    HPI Jeffrey Holder is a 24 y.o. male.   24 yo established patient presents with 7 weeks of post-wisdom tooth extraction pain radiating into both ears.  All 4 wisdom teeth were extracted and he has been using saline rinses religiously.  No fever.  Music teacher.     Past Medical History:  Diagnosis Date  . ADHD   . Bipolar 1 disorder (HCC)   . GAD (generalized anxiety disorder)   . Insomnia   . Panic disorder     There are no problems to display for this patient.   History reviewed. No pertinent surgical history.     Home Medications    Prior to Admission medications   Medication Sig Start Date End Date Taking? Authorizing Provider  amoxicillin (AMOXIL) 875 MG tablet Take 1 tablet (875 mg total) by mouth 2 (two) times daily. Take with food 09/26/20   Elvina Sidle, MD  amphetamine-dextroamphetamine (ADDERALL) 20 MG tablet Take 20 mg by mouth 2 (two) times daily.    [provider]  gabapentin (NEURONTIN) 300 MG capsule Take 300 mg by mouth 3 (three) times daily.    [provider]  omeprazole (PRILOSEC) 40 MG capsule Take 1 capsule (40 mg total) by mouth daily. Take 30 minutes before a meal. 07/20/16   Lattie Haw, MD  QUEtiapine (SEROQUEL) 25 MG tablet Take 25 mg by mouth at bedtime.    [provider]    Family History Family History  Problem Relation Age of Onset  . Healthy Mother   . Healthy Father     Social History Social History   Tobacco Use  . Smoking status: Former Smoker    Packs/day: 1.00    Quit date: 11/29/2017    Years since quitting: 2.8  . Smokeless tobacco: Never Used  Vaping Use  . Vaping Use: Some days  . Substances: Nicotine, Flavoring  Substance Use Topics  . 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 Drug use: Not Currently    Types: Marijuana    Comment: weekly     Allergies   Ciprofloxacin   Review of Systems Review of Systems  Constitutional: Negative.   HENT: Positive for dental problem.      Physical Exam Triage Vital Signs ED Triage Vitals  Enc Vitals Group     BP      Pulse      Resp      Temp      Temp src      SpO2      Weight      Height      Head Circumference      Peak Flow      Pain Score      Pain Loc      Pain Edu?      Excl. in GC?    No data found.  Updated Vital Signs BP 131/85 (BP Location: Right Arm)   Pulse (!) 109   Temp 97.7 F (36.5 C) (Oral)   Resp 18   SpO2 99%    Physical Exam Vitals and nursing note reviewed.  Constitutional:  General: He is not in acute distress.    Appearance: Normal appearance. He is normal weight. He is not ill-appearing.  HENT:     Head: Normocephalic and atraumatic.     Right Ear: Tympanic membrane normal.     Left Ear: Tympanic membrane normal.     Nose: Nose normal.     Mouth/Throat:     Mouth: Mucous membranes are moist.     Pharynx: Oropharynx is clear. Posterior oropharyngeal erythema present.     Comments: Mild erythema both lower gums at sites of teeth 17 and 32.  No exudates. Eyes:     Conjunctiva/sclera: Conjunctivae normal.  Cardiovascular:     Rate and Rhythm: Normal rate.  Pulmonary:     Effort: Pulmonary effort is normal.  Musculoskeletal:        General: Normal range of motion.     Cervical back: Normal range of motion and neck supple.  Lymphadenopathy:     Cervical: No cervical adenopathy.  Skin:    General: Skin is warm and dry.  Neurological:     General: No focal deficit present.     Mental Status: He is alert and oriented to person, place, and time.  Psychiatric:        Mood and Affect: Mood normal.        Thought Content: Thought content normal.      UC Treatments /  Results  Labs (all labs ordered are listed, but only abnormal results are displayed) Labs Reviewed - No data to display  EKG   Radiology No results found.  Procedures Procedures (including critical care time)  Medications Ordered in UC Medications - No data to display  Initial Impression / Assessment and Plan / UC Course  I have reviewed the triage vital signs and the nursing notes.  Pertinent labs & imaging results that were available during my care of the patient were reviewed by me and considered in my medical decision making (see chart for details).     Final Clinical Impressions(s) / UC Diagnoses   Final diagnoses:  Pain, dental     Discharge Instructions     Music:  Kathaleen Bury on TED talk    ED Prescriptions    Medication Sig Dispense Auth. Provider   amoxicillin (AMOXIL) 875 MG tablet Take 1 tablet (875 mg total) by mouth 2 (two) times daily. Take with food 20 tablet Elvina Sidle, MD     I have reviewed the PDMP during this encounter.   Elvina Sidle, MD 09/26/20 [phone]

## 2021-04-10 IMAGING — DX DG ABDOMEN 1V
2 series · 2 of 2 positions shown · non-contrast
Comparison: None.

CLINICAL DATA: Lower abdominal pain

EXAM:
ABDOMEN - 1 VIEW

[abdomen kub (1 of 2)]
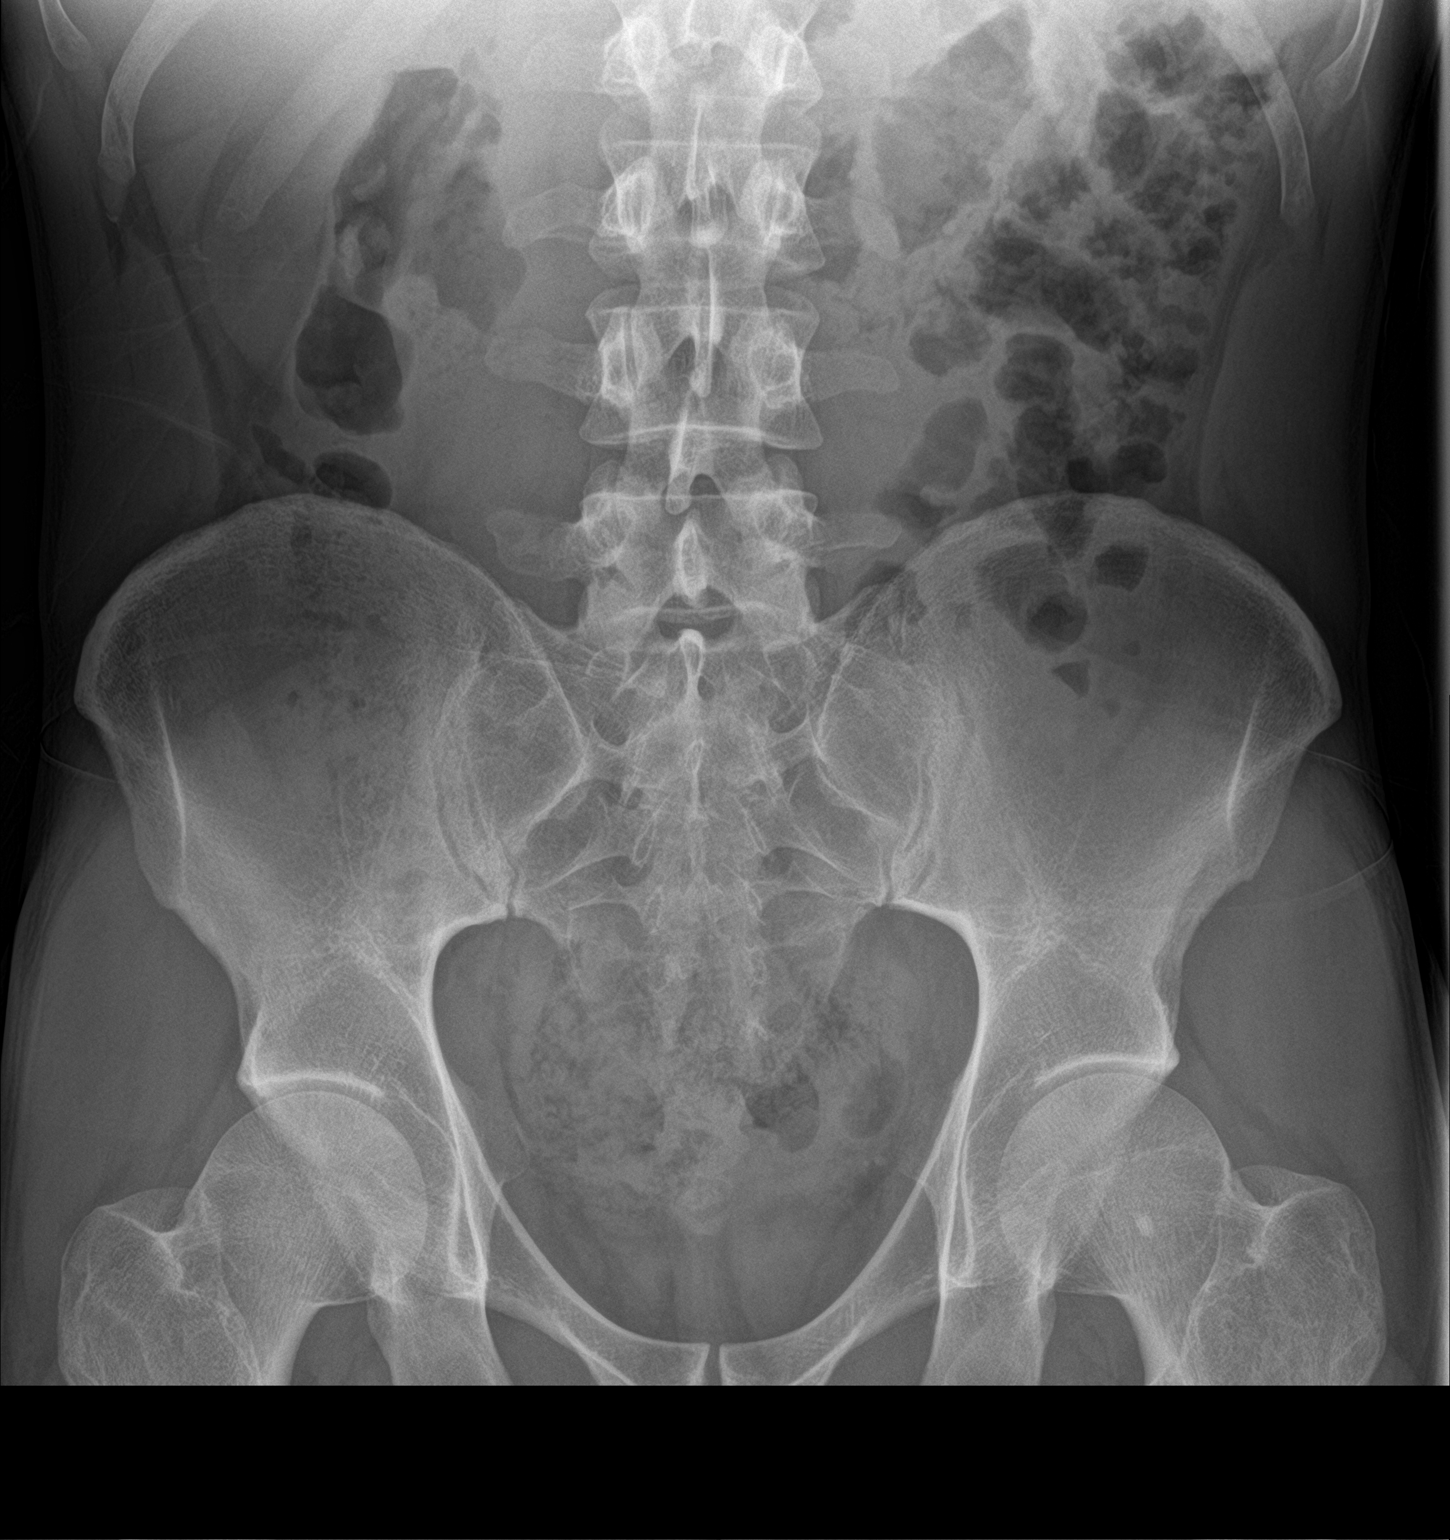

[abdomen kub (2 of 2)]
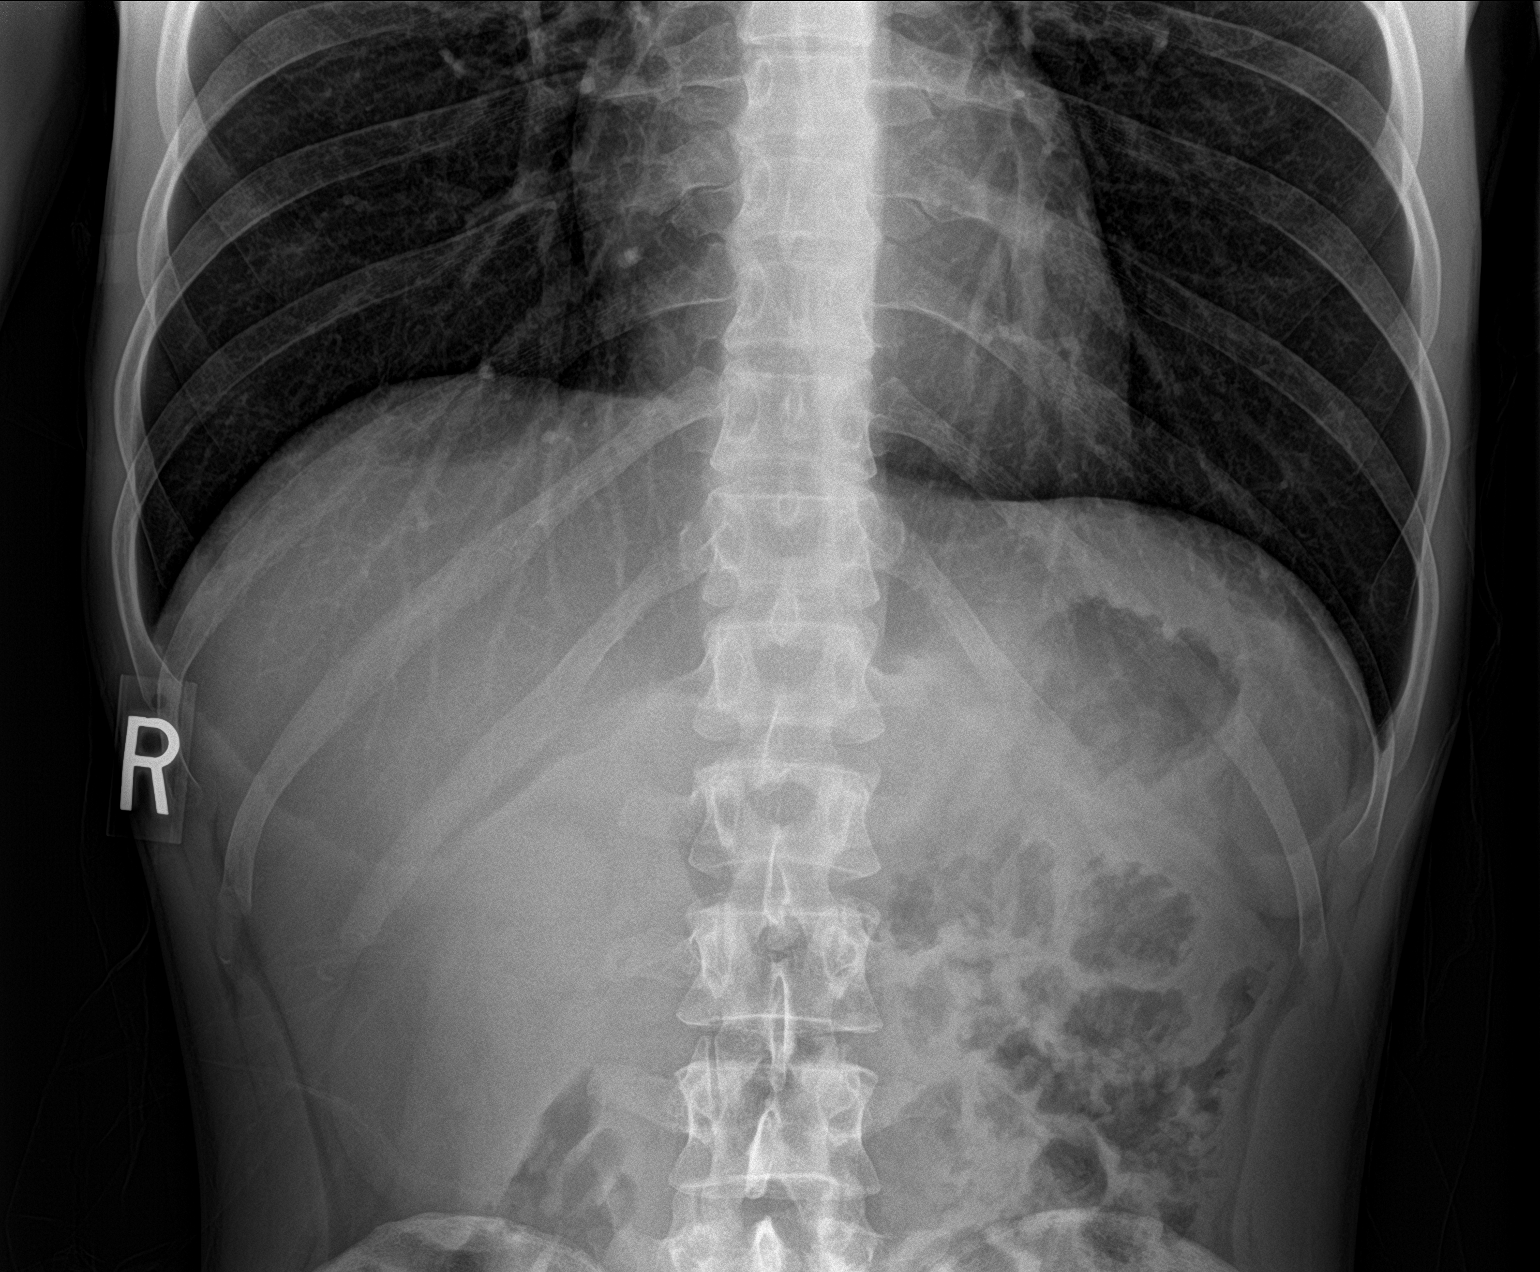

[2 of 2 positions shown; findings below may reference images not displayed]

FINDINGS: The bowel gas pattern is normal. Moderate amount of rectal colonic
stool present. No radio-opaque calculi or other significant
radiographic abnormality are seen.
IMPRESSION: Moderate amount of rectal colonic stool. Nonobstructive bowel gas
pattern.
